# Patient Record
Sex: Female | Born: 1935 | Race: White | Hispanic: No | Smoking: Former smoker
Health system: Southern US, Community
[De-identification: ages and names within clinical notes are randomized; demographics above are authoritative.]

## PROBLEM LIST (undated history)

## (undated) DIAGNOSIS — C50919 Malignant neoplasm of unspecified site of unspecified female breast: Secondary | ICD-10-CM

## (undated) DIAGNOSIS — D7589 Other specified diseases of blood and blood-forming organs: Secondary | ICD-10-CM

## (undated) DIAGNOSIS — E039 Hypothyroidism, unspecified: Secondary | ICD-10-CM

## (undated) DIAGNOSIS — R011 Cardiac murmur, unspecified: Secondary | ICD-10-CM

## (undated) DIAGNOSIS — R319 Hematuria, unspecified: Secondary | ICD-10-CM

## (undated) DIAGNOSIS — M810 Age-related osteoporosis without current pathological fracture: Secondary | ICD-10-CM

## (undated) DIAGNOSIS — E785 Hyperlipidemia, unspecified: Secondary | ICD-10-CM

## (undated) DIAGNOSIS — I10 Essential (primary) hypertension: Secondary | ICD-10-CM

## (undated) DIAGNOSIS — I8390 Asymptomatic varicose veins of unspecified lower extremity: Secondary | ICD-10-CM

## (undated) DIAGNOSIS — Z923 Personal history of irradiation: Secondary | ICD-10-CM

## (undated) HISTORY — PX: TONSILLECTOMY: SUR1361

## (undated) HISTORY — DX: Hematuria, unspecified: R31.9

## (undated) HISTORY — PX: OTHER SURGICAL HISTORY: SHX169

## (undated) HISTORY — DX: Malignant neoplasm of unspecified site of unspecified female breast: C50.919

## (undated) HISTORY — DX: Hypothyroidism, unspecified: E03.9

## (undated) HISTORY — DX: Essential (primary) hypertension: I10

## (undated) HISTORY — PX: TUBAL LIGATION: SHX77

## (undated) HISTORY — DX: Cardiac murmur, unspecified: R01.1

## (undated) HISTORY — DX: Hyperlipidemia, unspecified: E78.5

## (undated) HISTORY — DX: Asymptomatic varicose veins of unspecified lower extremity: I83.90

## (undated) HISTORY — DX: Age-related osteoporosis without current pathological fracture: M81.0

## (undated) HISTORY — PX: APPENDECTOMY: SHX54

## (undated) HISTORY — DX: Other specified diseases of blood and blood-forming organs: D75.89

---

## 1998-03-23 HISTORY — PX: OTHER SURGICAL HISTORY: SHX169

## 2001-08-04 ENCOUNTER — Ambulatory Visit (HOSPITAL_COMMUNITY): Admission: RE | Admit: 2001-08-04 | Discharge: 2001-08-04 | Payer: Self-pay | Admitting: Gastroenterology

## 2007-01-22 HISTORY — PX: CYST EXCISION: SHX5701

## 2008-04-16 ENCOUNTER — Ambulatory Visit (HOSPITAL_BASED_OUTPATIENT_CLINIC_OR_DEPARTMENT_OTHER): Admission: RE | Admit: 2008-04-16 | Discharge: 2008-04-16 | Payer: Self-pay | Admitting: Surgery

## 2008-04-16 ENCOUNTER — Encounter (INDEPENDENT_AMBULATORY_CARE_PROVIDER_SITE_OTHER): Payer: Self-pay | Admitting: Surgery

## 2008-04-16 DIAGNOSIS — C50919 Malignant neoplasm of unspecified site of unspecified female breast: Secondary | ICD-10-CM

## 2008-04-16 HISTORY — DX: Malignant neoplasm of unspecified site of unspecified female breast: C50.919

## 2008-05-01 ENCOUNTER — Ambulatory Visit: Payer: Self-pay | Admitting: Oncology

## 2008-05-03 ENCOUNTER — Ambulatory Visit: Admission: RE | Admit: 2008-05-03 | Discharge: 2008-07-12 | Payer: Self-pay | Admitting: Radiation Oncology

## 2008-05-29 ENCOUNTER — Ambulatory Visit (HOSPITAL_COMMUNITY): Admission: RE | Admit: 2008-05-29 | Discharge: 2008-05-29 | Payer: Self-pay | Admitting: Surgery

## 2008-05-29 ENCOUNTER — Encounter (INDEPENDENT_AMBULATORY_CARE_PROVIDER_SITE_OTHER): Payer: Self-pay | Admitting: Surgery

## 2008-07-11 DIAGNOSIS — Z923 Personal history of irradiation: Secondary | ICD-10-CM

## 2008-07-11 HISTORY — DX: Personal history of irradiation: Z92.3

## 2010-07-03 LAB — CBC
HCT: 39.3 % (ref 36.0–46.0)
MCHC: 35 g/dL (ref 30.0–36.0)
MCV: 97.9 fL (ref 78.0–100.0)
Platelets: 268 10*3/uL (ref 150–400)
RDW: 12.8 % (ref 11.5–15.5)

## 2010-07-07 LAB — CBC
HCT: 39.1 % (ref 36.0–46.0)
Platelets: 282 10*3/uL (ref 150–400)
RDW: 12.6 % (ref 11.5–15.5)
WBC: 6.8 10*3/uL (ref 4.0–10.5)

## 2010-07-07 LAB — POCT I-STAT, CHEM 8
BUN: 22 mg/dL (ref 6–23)
Hemoglobin: 13.9 g/dL (ref 12.0–15.0)
Potassium: 3.7 mEq/L (ref 3.5–5.1)
Sodium: 142 mEq/L (ref 135–145)
TCO2: 27 mmol/L (ref 0–100)

## 2010-07-07 LAB — DIFFERENTIAL
Basophils Absolute: 0 10*3/uL (ref 0.0–0.1)
Eosinophils Relative: 1 % (ref 0–5)
Lymphocytes Relative: 27 % (ref 12–46)
Lymphs Abs: 1.8 10*3/uL (ref 0.7–4.0)
Neutrophils Relative %: 63 % (ref 43–77)

## 2010-07-10 IMAGING — CR DG CHEST 1V PORT
1 series · 1 of 1 positions shown · non-contrast
Comparison: None

CLINICAL DATA: Preop for surgery for right breast mass

PORTABLE CHEST - 1 VIEW

[view not recorded]
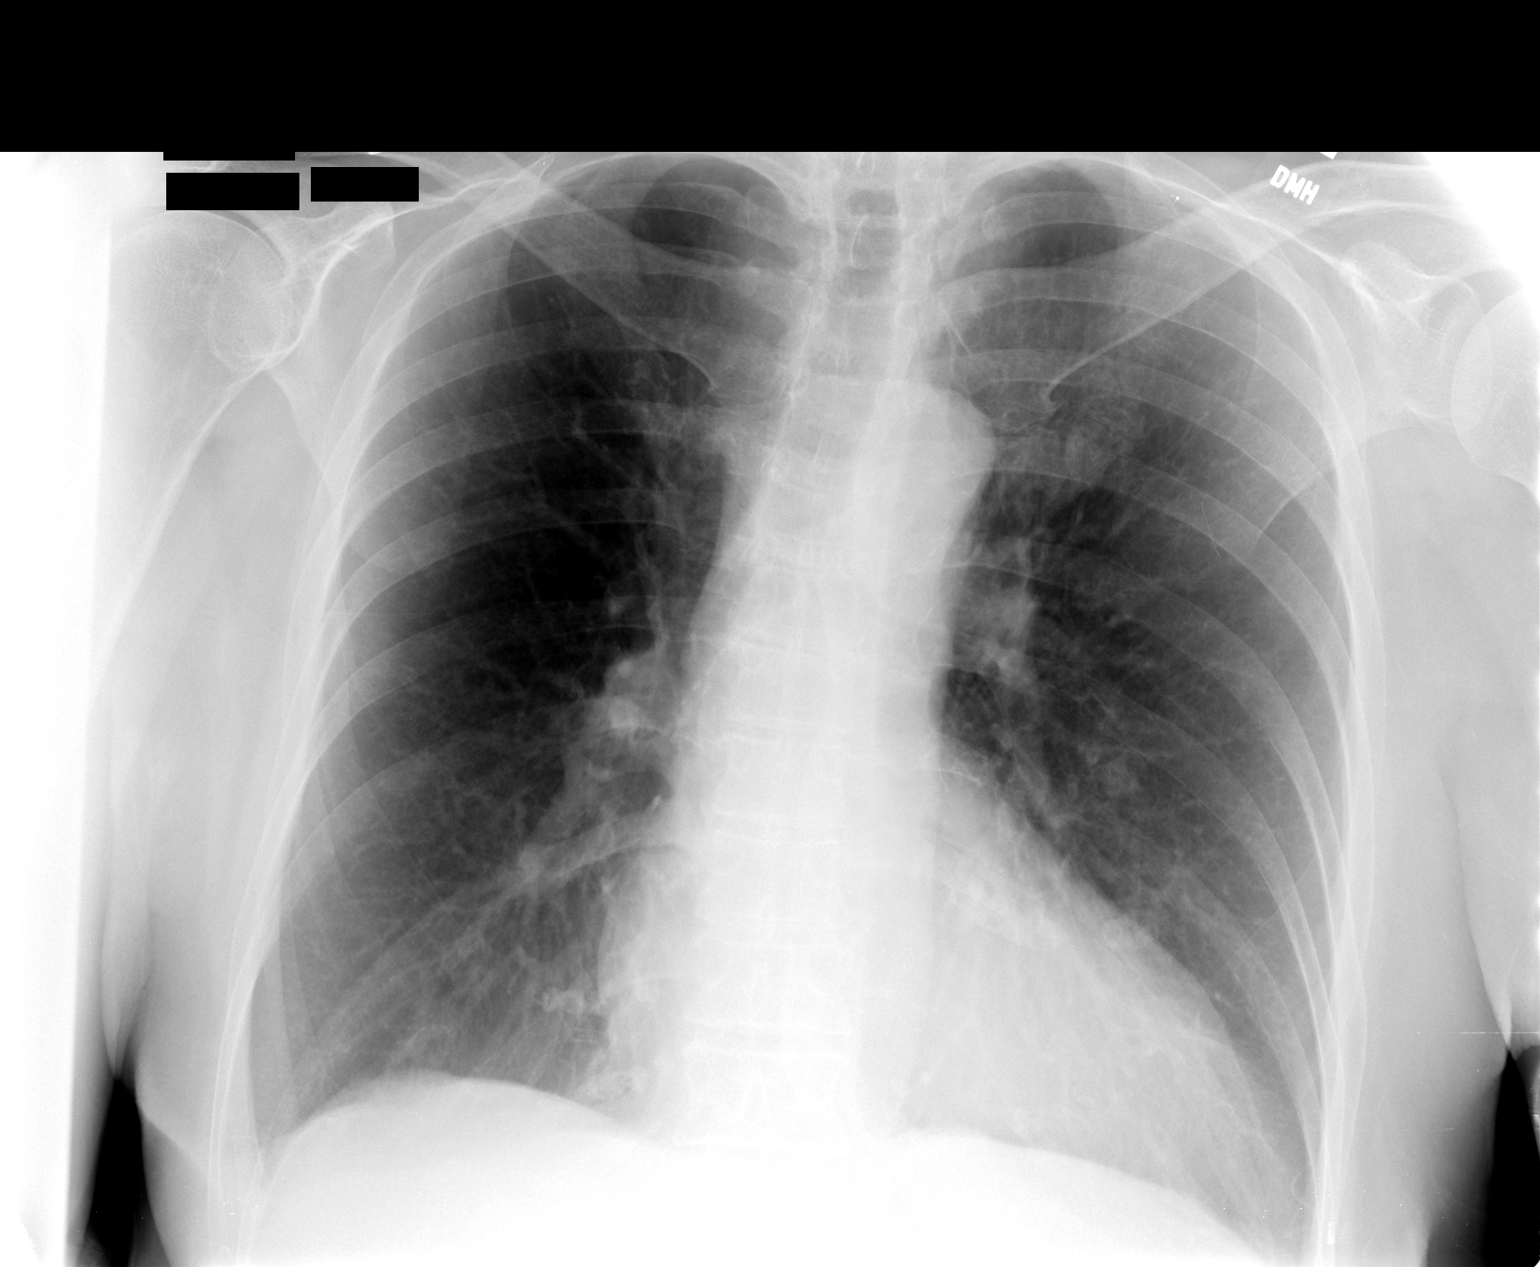

[1 of 1 positions shown; findings below may reference images not displayed]

FINDINGS: No active infiltrate or effusion is seen.  There is a
small nodular opacity in the left mid lung base.  This could
represent overlapping vascular structures but attention on follow-
up chest x-ray is recommended.  Mild cardiomegaly is present.  No
bony abnormality is seen.
IMPRESSION: 1.  No active lung disease.
2.  Questionable nodular opacity in the left mid lung base.
Recommend attention to this area on follow-up chest x-ray.

## 2010-08-05 NOTE — Op Note (Signed)
[name], Catherine Herman               ACCOUNT NO.:  [id number]   MEDICAL RECORD NO.:  [id number]          PATIENT TYPE:  AMB   LOCATION:  DSC                          FACILITY:  [facility]   PHYSICIAN:  Thomas A. Cornett, M.D.DATE OF BIRTH:  [birth year]   DATE OF PROCEDURE:  04/16/2008  DATE OF DISCHARGE:                               OPERATIVE REPORT   PREOPERATIVE DIAGNOSIS:  Right breast mass.   POSTOPERATIVE DIAGNOSIS:  Right breast mass.   PROCEDURE:  Excisional biopsy of right breast mass.   SURGEON:  Maisie Fus A. Cornett, MD   ANESTHESIA:  LMA with 0.25% Sensorcaine local.   ESTIMATED BLOOD LOSS:  10 mL.   SPECIMEN:  Right breast tissue located at about 12 o'clock approximately  2 cm from the edge of the nipple sent to pathology for evaluation.   INDICATIONS FOR PROCEDURE:  The patient is a 77 year old female who had  trauma to her right breast about a year ago.  She developed a painful  knot in her right breast.  This had become quite uncomfortable and she  wished to have it removed.  Her radiological workup was negative but  given the area was quite hard and tender.  She presents today for  excision due to symptoms of pain.   DESCRIPTION OF PROCEDURE:  The patient was brought to the operating  room.  In the holding area, I examined the patient while she was awake,  palpated the area to verify the proper location of the mass, and marked  this myself.  She was then taken back to the operating room and after  induction of LMA anesthesia, right breast was prepped and draped in a  sterile fashion.  The area that was marked was injected with 0.25%  Sensorcaine local.  A transverse incision was made over the mass.  All  the tissue was excised around the mass.  This had the appearance of a  fibroadenoma.  The entire area was excised at the 12 o'clock position  just above the nipple.  Irrigation was used.  Hemostasis was achieved.  The wound was closed with a deep layer of 3-0 Vicryl  and subsequent 4-0  Monocryl stitch.  Dermabond was applied.  All final counts of sponge,  needle, and instruments were found to be correct at this portion of the  case.      Thomas A. Cornett, M.D.  Electronically Signed    TAC/MEDQ  D:  04/16/2008  T:  04/17/2008  Job:  [id number]   cc:   Waynard Reeds, M.D.

## 2010-08-05 NOTE — Op Note (Signed)
[name], Catherine Herman               ACCOUNT NO.:  [id number]   MEDICAL RECORD NO.:  [id number]          PATIENT TYPE:  AMB   LOCATION:  SDS                          FACILITY:  [facility]   PHYSICIAN:  Thomas A. Cornett, M.D.DATE OF BIRTH:  12-Oct-1929   DATE OF PROCEDURE:  05/29/2008  DATE OF DISCHARGE:  05/29/2008                               OPERATIVE REPORT   PREOPERATIVE DIAGNOSIS:  Right breast cancer.   POSTOPERATIVE DIAGNOSIS:  Right breast cancer.   PROCEDURE:  Right axillary sentinel lymph node mapping with methylene  blue dye injection.   SURGEON:  Maisie Fus A. Cornett, MD   ANESTHESIA:  LMA with 0.25% Sensorcaine local.   ESTIMATED BLOOD LOSS:  10 mL.   SPECIMEN:  Two right axillary sentinel lymph nodes negative by touch  prep.   DRAINS:  None.   INDICATIONS FOR PROCEDURE:  The patient is a 77 year old female found to  have a right breast cancer by open right breast biopsy.  She returns  today for lymph node evaluation and mapping.   DESCRIPTION OF PROCEDURE:  The patient was brought to the operating room  and placed supine.  After induction of LMA anesthesia, the right breast  and right axilla were prepped and draped in sterile fashion.  Sensorcaine 0.25% with epinephrine was used and this was injected into  the right axillary region.  NeoProbe was used to mark the site of  greatest activity, incision was made over this and dissection was  carried down into the axillary contents.  Two hot, but not blue sentinel  nodes were identified.  There was no evidence of any blue sentinel nodes  that I could see in the right axilla.  Methylene blue dye 4 mL was  injected in a subcutaneous and subareolar position involving the lateral  aspect of the right nipple prior to skin incision.  At this point, since  I found no evidence of any blue nodes, but the hot nodes, these were  sent to pathology and found to be negative by touch prep.  There was no  other radioactivity in the  axilla noted.  Irrigation was used and this  was suctioned out.  The wound was  closed in layers using a deep layer of 3-0 Vicryl and subsequent 4-0  Monocryl stitch.  Dermabond was applied.  All final counts, sponge,  needle, and instruments were found to be correct at this portion of the  case.  The patient was awakened and taken to recovery in satisfactory  condition.      Thomas A. Cornett, M.D.  Electronically Signed     TAC/MEDQ  D:  05/29/2008  T:  05/29/2008  Job:  [id number]   cc:   Cranford Mon, MD

## 2010-08-08 NOTE — Procedures (Signed)
[place]  Patient:    Catherine Herman, Catherine Herman Visit Number: [id number] MRN: [id number]          Service Type: END Location: ENDO Attending Physician:  Orland Mustard Dictated by:   Llana Aliment. Randa Evens, M.D. Proc. Date: 08/04/01 Admit Date:  08/04/2001   CC:         Gaspar Garbe, M.D.   Procedure Report  DATE OF BIRTH:  Oct 17, 1929.  PROCEDURE:  Colonoscopy.  MEDICATIONS:  Fentanyl 75 mcg, Versed 6 mg IV.  SCOPE:  Olympus pediatric video colonoscope.  INDICATION:  A strong family history of colon cancer in her father.  DESCRIPTION OF PROCEDURE:  The procedure had been explained to the patient and consent obtained.  With the patient in the left lateral decubitus position, the Olympus pediatric video colonoscope inserted.  Prep was excellent.  We were able to reach the cecum without difficulty.  The ileocecal valve and appendiceal orifice were seen.  The scope was withdrawn and the cecum, ascending colon, hepatic flexure, transverse colon, splenic flexure, descending, and sigmoid colon were seen well upon removal and no polyps seen throughout the entire colon.  The rectum was also free of polyps.  Scope withdrawn.  The patient tolerated the procedure well.  ASSESSMENT:  No polyps seen throughout the entire colon in this high-risk individual.  PLAN:  Routine yearly Hemoccults, and will recommend repeating colonoscopy in five years. Dictated by:   Llana Aliment. Randa Evens, M.D. Attending Physician:  Orland Mustard DD:  08/04/01 TD:  08/05/01 Job: [id number]

## 2012-09-22 ENCOUNTER — Encounter: Payer: Self-pay | Admitting: Radiation Oncology

## 2012-09-28 ENCOUNTER — Encounter: Payer: Self-pay | Admitting: Radiation Oncology

## 2012-09-28 DIAGNOSIS — C50919 Malignant neoplasm of unspecified site of unspecified female breast: Secondary | ICD-10-CM | POA: Insufficient documentation

## 2012-09-30 ENCOUNTER — Ambulatory Visit
Admission: RE | Admit: 2012-09-30 | Discharge: 2012-09-30 | Disposition: A | Discharge status: Home / Self Care | Payer: Medicare Other | Source: Ambulatory Visit | Attending: Radiation Oncology | Admitting: Radiation Oncology

## 2012-09-30 DIAGNOSIS — C50911 Malignant neoplasm of unspecified site of right female breast: Secondary | ICD-10-CM

## 2012-09-30 HISTORY — DX: Personal history of irradiation: Z92.3

## 2012-09-30 NOTE — Progress Notes (Signed)
Pt escorted to exam room 1 to see Dr Michell Heinrich. No VS or nurse assessment for this visit per Dr Michell Heinrich. Pt has pleasant affect today.

## 2012-10-03 ENCOUNTER — Telehealth: Payer: Self-pay | Admitting: *Deleted

## 2012-10-03 NOTE — Progress Notes (Signed)
   Department of Radiation Oncology  Phone:  [phone] Fax:        [phone]   Name: Catherine Herman MRN: [id number]  DOB: 1929-12-01  Date: 09/30/12  Follow Up Visit Note  Diagnosis: T1cN0 right breast cancer  Summary and Interval since last radiation:  50 Gy to the right breast completed 07/11/08 in [place] (declined antiestrogen therapy)  Interval History: Catherine Herman presents today for routine followup.  She has returned from a trip to [place] to visit a friend. 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 She has had more problems with aortic stenosis and possibly a "mini heart attack" which she is managing medially for now.  She is concerned about new left breast tenderness and lumpiness of the upper portion of the left breast. Right breast is feeling fine and not tender. Her last mammogram was in October of last year at [facility] and was clear.   Allergies: Allergies not on file  Medications:  No current outpatient prescriptions on file.   No current facility-administered medications for this encounter.    Physical Exam:  There were no vitals filed for this visit. Pleasant elderly female. No distress. Some scarring palpable around her incision on the right. She has some firm tissue in the upper outer and inner quadrant of the left breast. No discrete masses that I can feel. The nipple is normal and there is no palpable axillary adenopathy bilaterally.   IMPRESSION: Catherine Herman is a 79 y.o. female s/p breast conservation of the right breast with new left breast tenderness and a palpable abnormality.  PLAN:  I ordered a mammogram for next week. I will call her with those results.  I will see her back in a year otherwise.     Lurline Hare, MD

## 2012-10-03 NOTE — Telephone Encounter (Signed)
Called patient to inform of mammogram appt. On 10-11-12 - arrival time - 1:30 pm, spoke with patient and she is aware of this test and to report to [facility] for this test.

## 2012-10-11 ENCOUNTER — Other Ambulatory Visit: Payer: Self-pay | Admitting: Radiation Oncology

## 2012-10-11 DIAGNOSIS — C50911 Malignant neoplasm of unspecified site of right female breast: Secondary | ICD-10-CM

## 2012-11-01 ENCOUNTER — Telehealth: Payer: Self-pay | Admitting: *Deleted

## 2012-11-01 NOTE — Telephone Encounter (Signed)
Called patient, spoke with her and informed her that her mammogram was normal and that Dr,.Tyler Aas will see you in 1 year for a follow up, asked if she wanted me to transfer her to scheduler now and make that appt?patient stated "no, I don't have my calender I'll call and make that appt myself later date",  9:09 AM

## 2013-11-16 ENCOUNTER — Ambulatory Visit
Admission: RE | Admit: 2013-11-16 | Discharge: 2013-11-16 | Disposition: A | Discharge status: Home / Self Care | Payer: Medicare Other | Source: Ambulatory Visit | Attending: Radiation Oncology | Admitting: Radiation Oncology

## 2013-11-16 VITALS — BP 160/78 | HR 64 | Temp 98.3°F | Wt 146.5 lb

## 2013-11-16 DIAGNOSIS — C50911 Malignant neoplasm of unspecified site of right female breast: Secondary | ICD-10-CM

## 2013-11-16 NOTE — Progress Notes (Signed)
Patient here for routine yearly follow up right breast cancer.Denies any problems.Healing from fall sustained by walking dog.

## 2013-11-17 NOTE — Progress Notes (Signed)
   Department of Radiation Oncology  Phone:  [phone] Fax:        [phone]   Name: Catherine Herman MRN: [id number]  DOB: 1929/07/19  Date: 11/17/13  Follow Up Visit Note  Diagnosis: T1cN0 right breast cancer  Summary and Interval since last radiation:  50 Gy to the right breast completed 07/11/08 in [place] (declined antiestrogen therapy)  Interval History: Catherine Herman presents today for routine followup.  She still has tenderness of her left breast. She had negative mammograms in March. 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 She is accompanied by her partner. She had a recent fall when a visiting dog knocked her down but she had no significant injuries form that.   Allergies:  Allergies  Allergen Reactions  . Bactrim [Sulfamethoxazole-Tmp Ds] Hives    Medications:  Current Outpatient Prescriptions  Medication Sig Dispense Refill  . aspirin 81 MG tablet Take 81 mg by mouth daily.      . cholecalciferol (VITAMIN D) 1000 UNITS tablet Take 1,000 Units by mouth daily.      Marland Kitchen levothyroxine (SYNTHROID, LEVOTHROID) 125 MCG tablet Take 125 mcg by mouth daily before breakfast.      . metoprolol succinate (TOPROL-XL) 25 MG 24 hr tablet Take 25 mg by mouth daily. 2 q am 1 q pm.       No current facility-administered medications for this encounter.    Physical Exam:  Filed Vitals:   11/16/13 1533  BP: 160/78  Pulse: 64  Temp: 98.3 F (36.8 C)   Pleasant elderly female. No distress. Some scarring palpable around her incision on the right. She has some firm tissue in the upper outer and inner quadrant of the left breast. No discrete masses that I can feel. The nipple is normal and there is no palpable axillary adenopathy bilaterally.   IMPRESSION: Catherine Herman is a 80 y.o. female s/p breast conservation of the right breast with new left breast tenderness and a palpable abnormality.  PLAN:  She had completed 5 years of follow up. She feels comfortable that she can be followed with mammograms which she would  like me to order and then by her PCP for her other medical issues. She knows that I am back in [place] and she can call me at any time to be seen there if she has questions or concerns.    Thea Silversmith, MD

## 2014-01-22 ENCOUNTER — Encounter: Payer: Self-pay | Admitting: Radiation Oncology

## 2014-11-13 ENCOUNTER — Other Ambulatory Visit: Payer: Self-pay | Admitting: Radiation Oncology

## 2014-11-13 DIAGNOSIS — C50911 Malignant neoplasm of unspecified site of right female breast: Secondary | ICD-10-CM

## 2014-11-20 ENCOUNTER — Telehealth: Payer: Self-pay | Admitting: *Deleted

## 2014-11-20 NOTE — Telephone Encounter (Signed)
CALLED PATIENT TO INFORM OF MAMMOGRAM ON 11-28-14 @ [place], SPOKE WITH PATIENT AND SHE IS AWARE OF THIS TEST
